# Patient Record
Sex: Female | Born: 2000 | Race: White | Hispanic: No | Marital: Single
Health system: Southern US, Community
[De-identification: ages and names within clinical notes are randomized; demographics above are authoritative.]

## PROBLEM LIST (undated history)

## (undated) DIAGNOSIS — J45909 Unspecified asthma, uncomplicated: Secondary | ICD-10-CM

## (undated) DIAGNOSIS — G43909 Migraine, unspecified, not intractable, without status migrainosus: Secondary | ICD-10-CM

---
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
## 2020-04-29 IMAGING — CR DG ANKLE COMPLETE 3+V*R*
3 series · 3 of 3 positions shown · non-contrast
Comparison: None.

CLINICAL DATA: RIGHT lateral ankle injury last night, some pain
with weight-bearing.

EXAM:
RIGHT ANKLE - COMPLETE 3+ VIEW

[t ankle joint ap right]
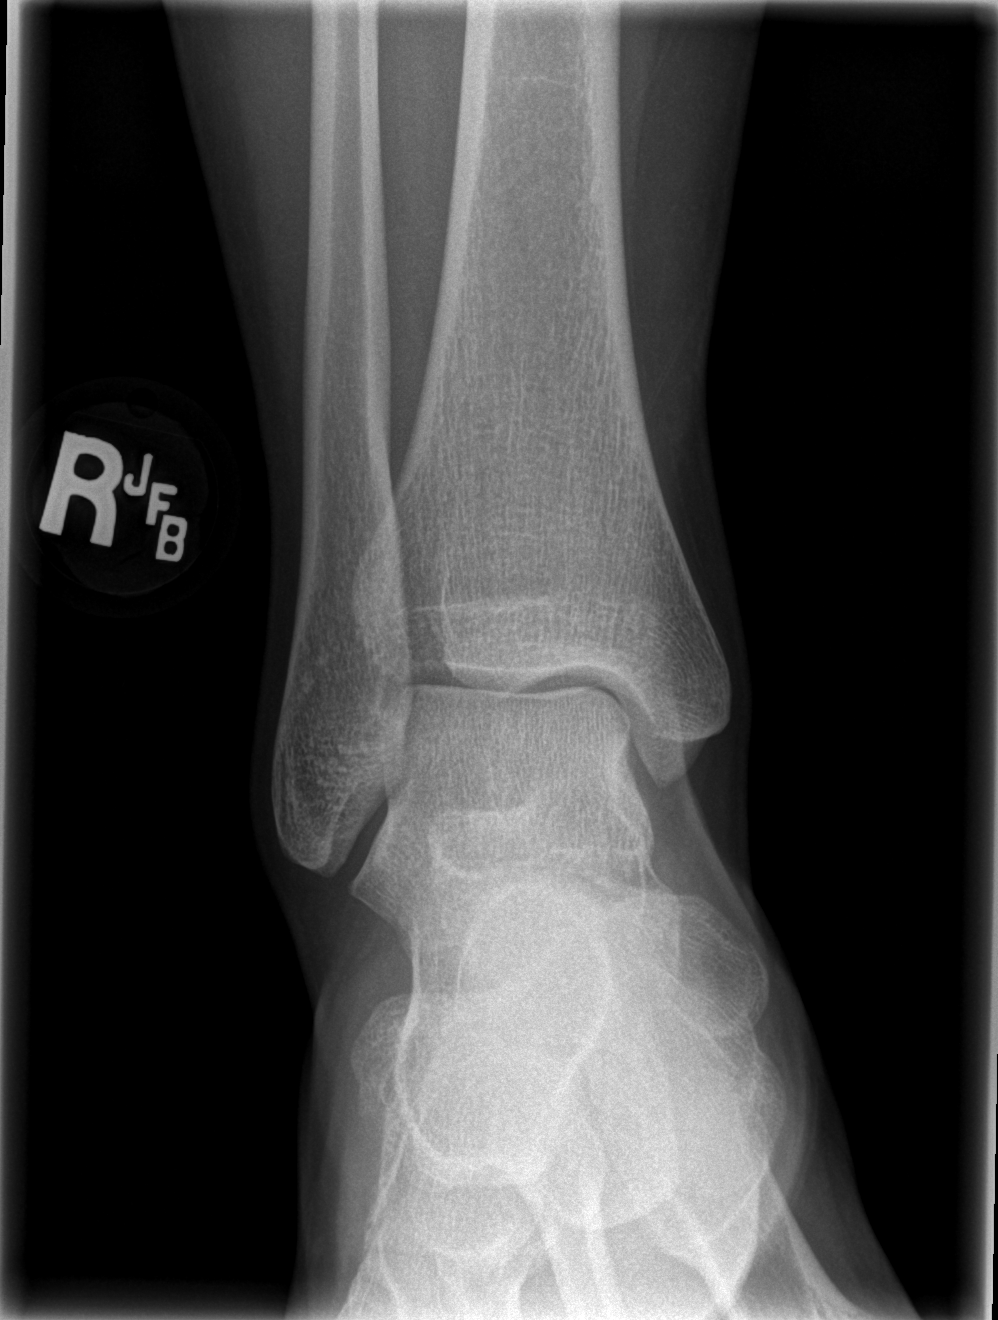

[t ankle joint oblique right]
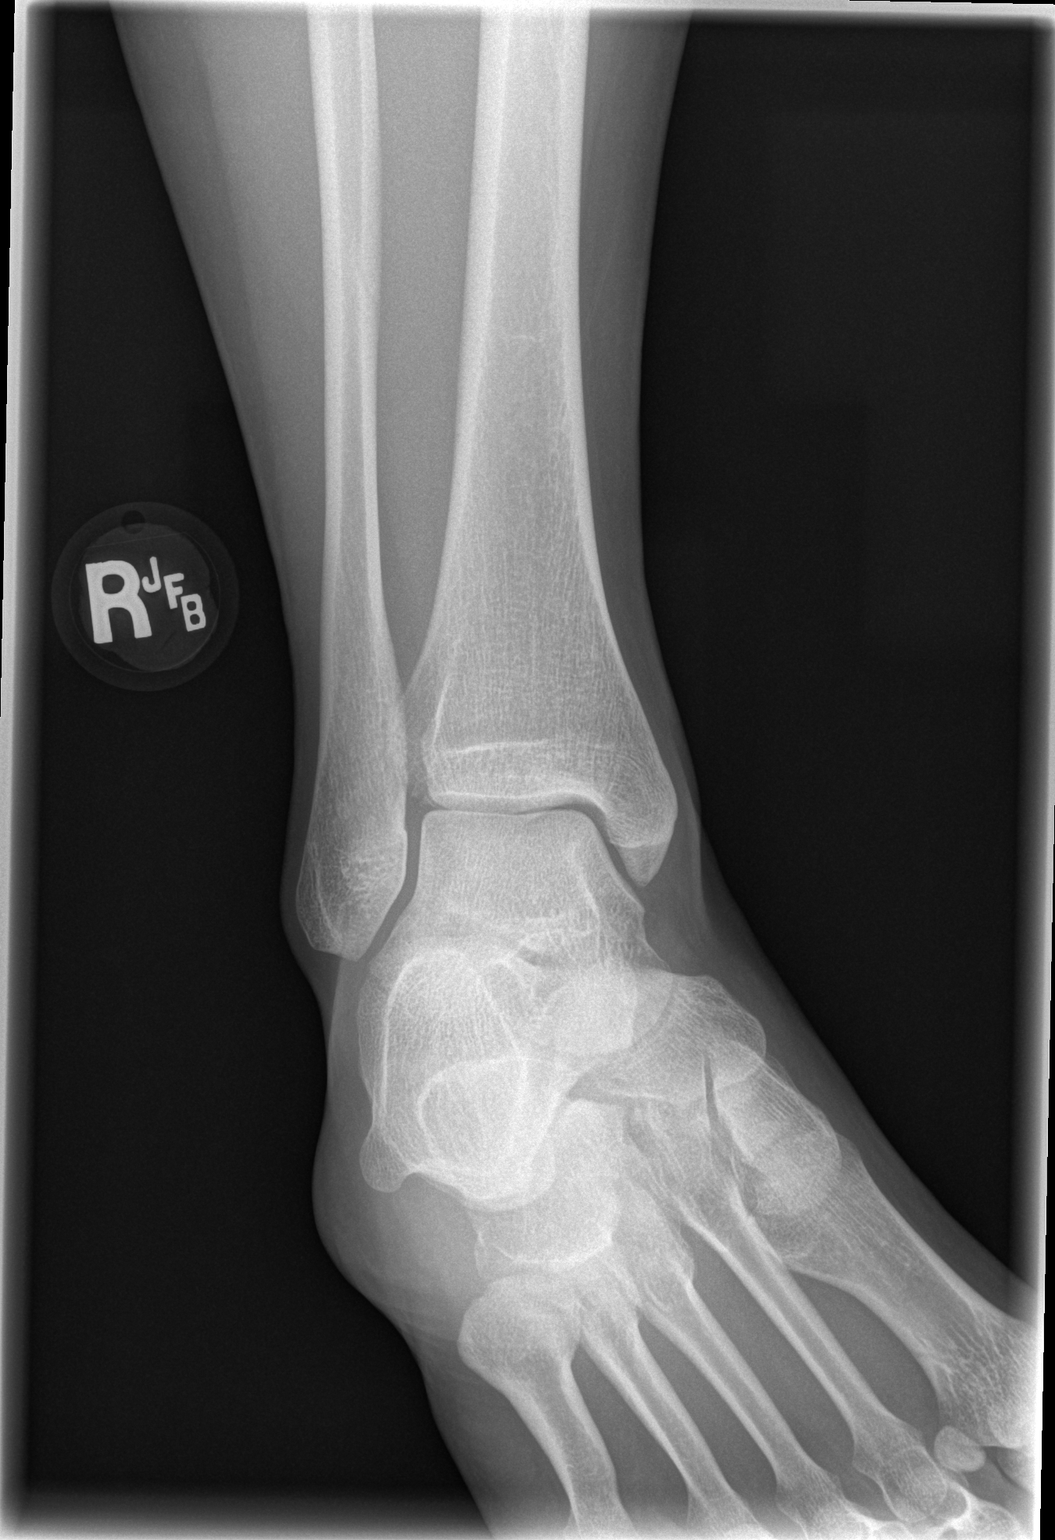

[t ankle joint lat right]
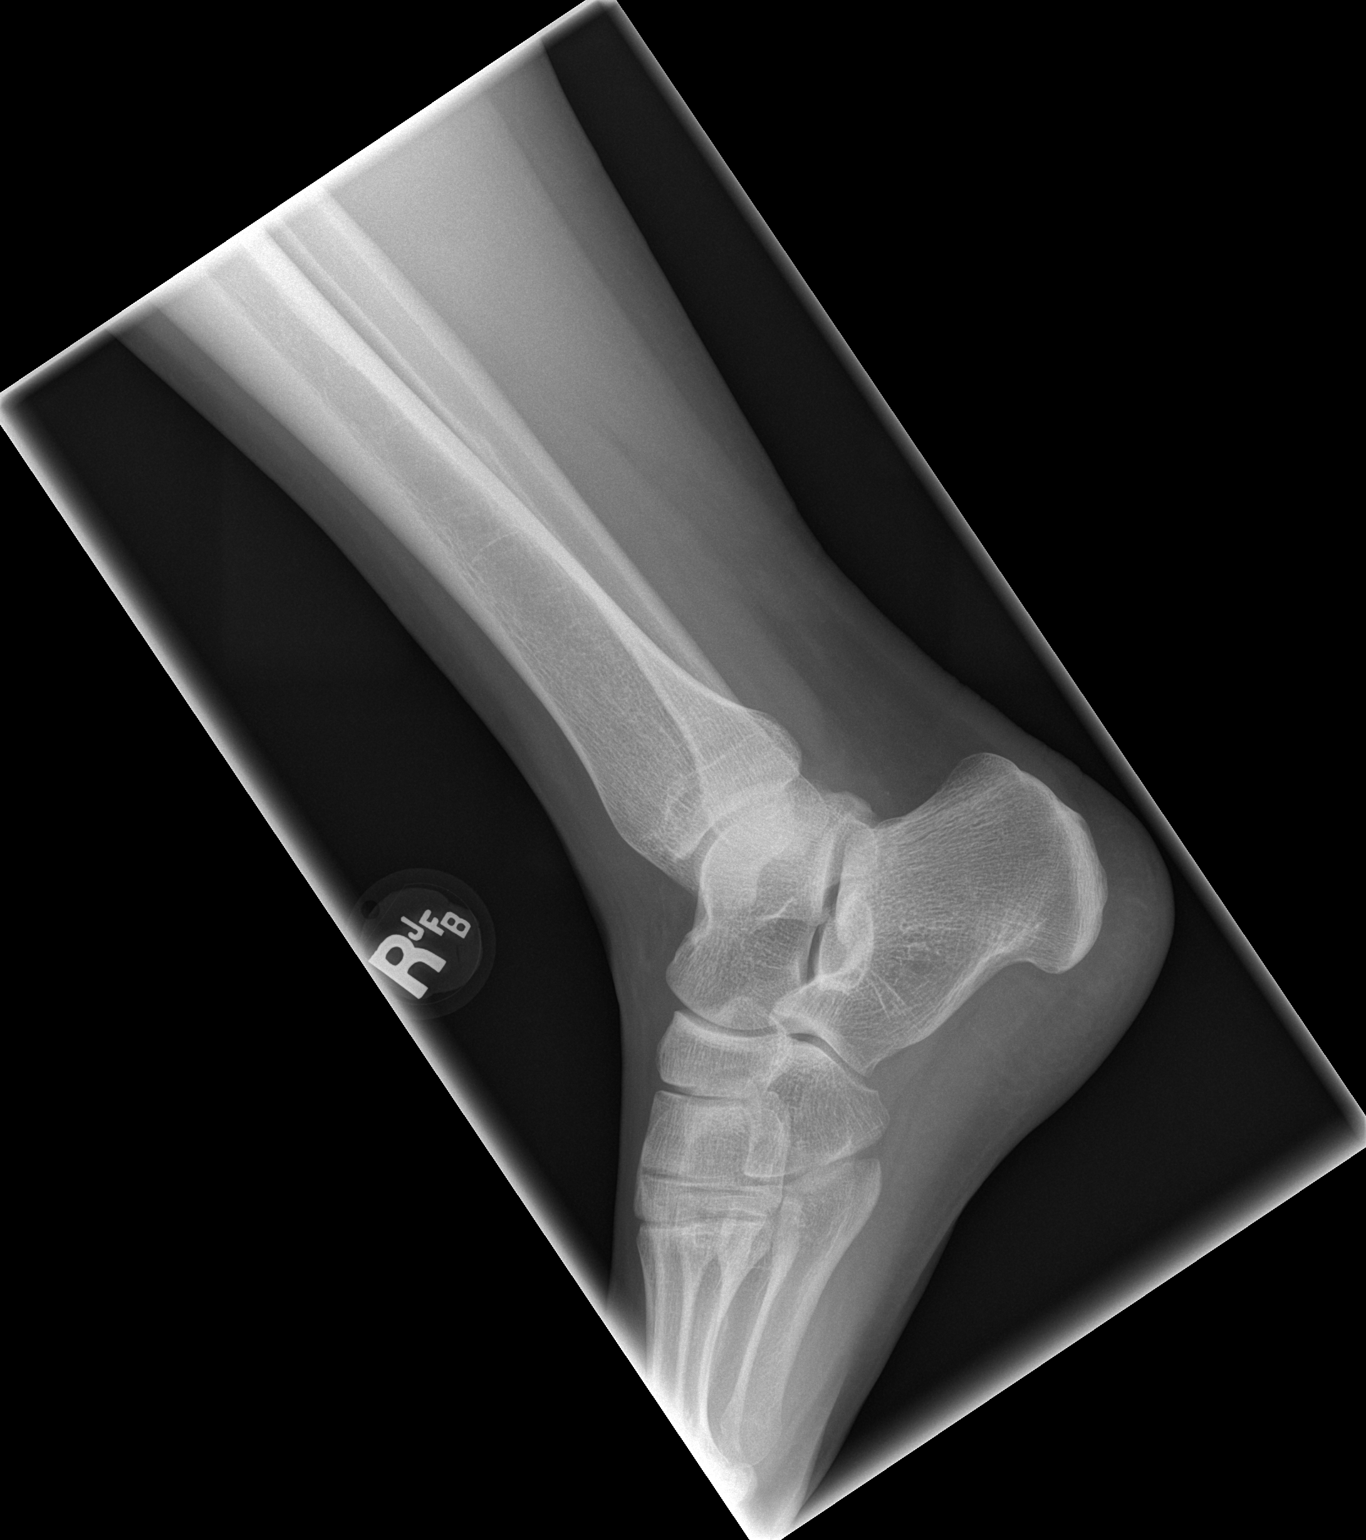

[3 of 3 positions shown; findings below may reference images not displayed]

FINDINGS: Osseous alignment is normal. Ankle mortise is symmetric. No fracture
line or displaced fracture fragment seen. Visualized portions of the
hindfoot and midfoot appear intact and normally aligned. Soft
tissues about the LEFT foot are unremarkable.
IMPRESSION: Negative.

## 2020-05-28 IMAGING — DX DG KNEE COMPLETE 4+V*L*
4 series · 4 of 4 positions shown · non-contrast
Comparison: None.

CLINICAL DATA: Soccer injury with left knee pain

EXAM:
LEFT KNEE - COMPLETE 4+ VIEW

[knee ap]
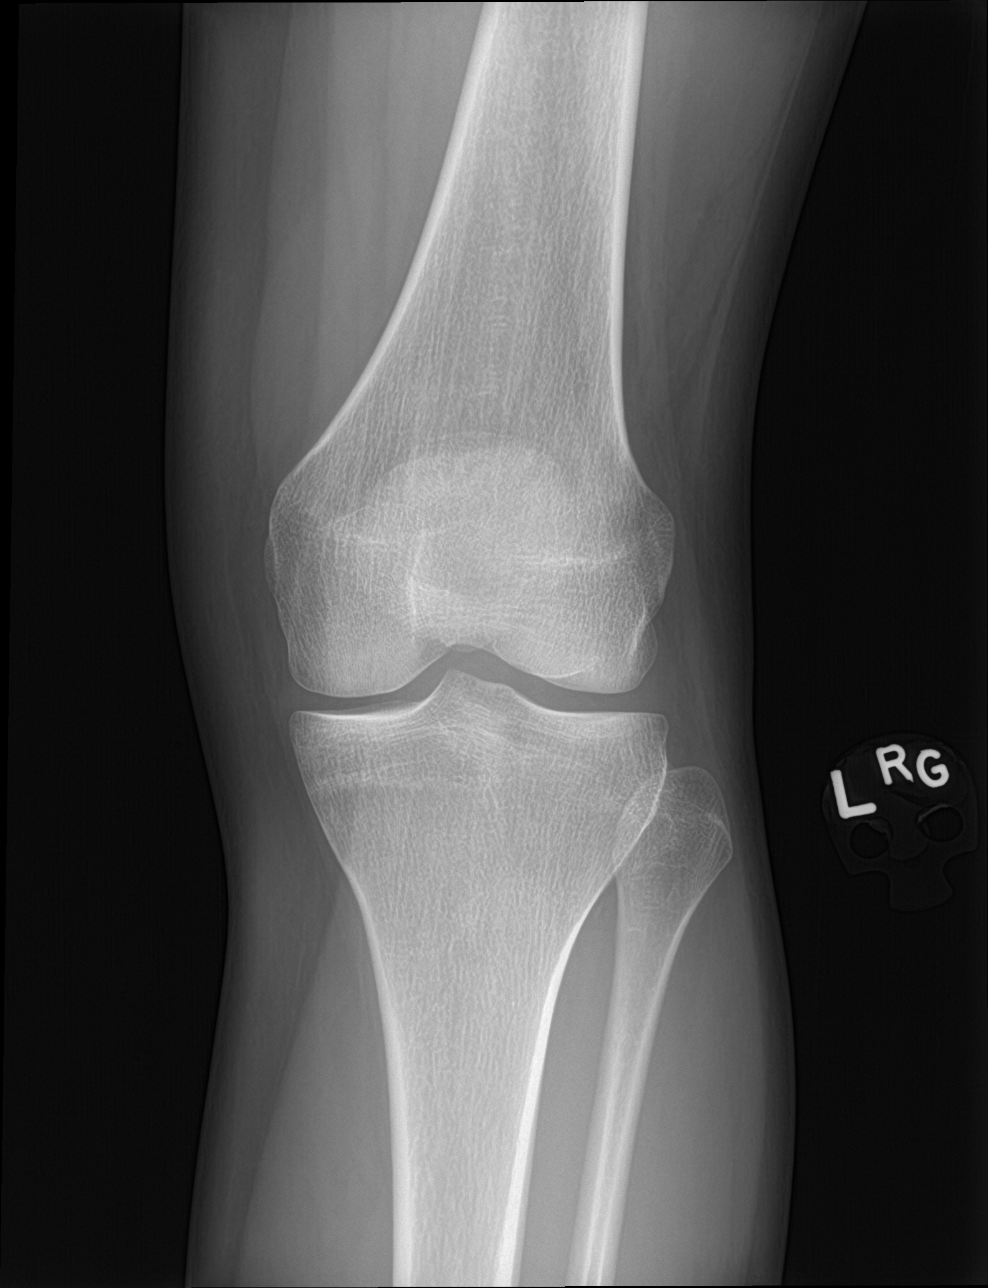

[knee lat]
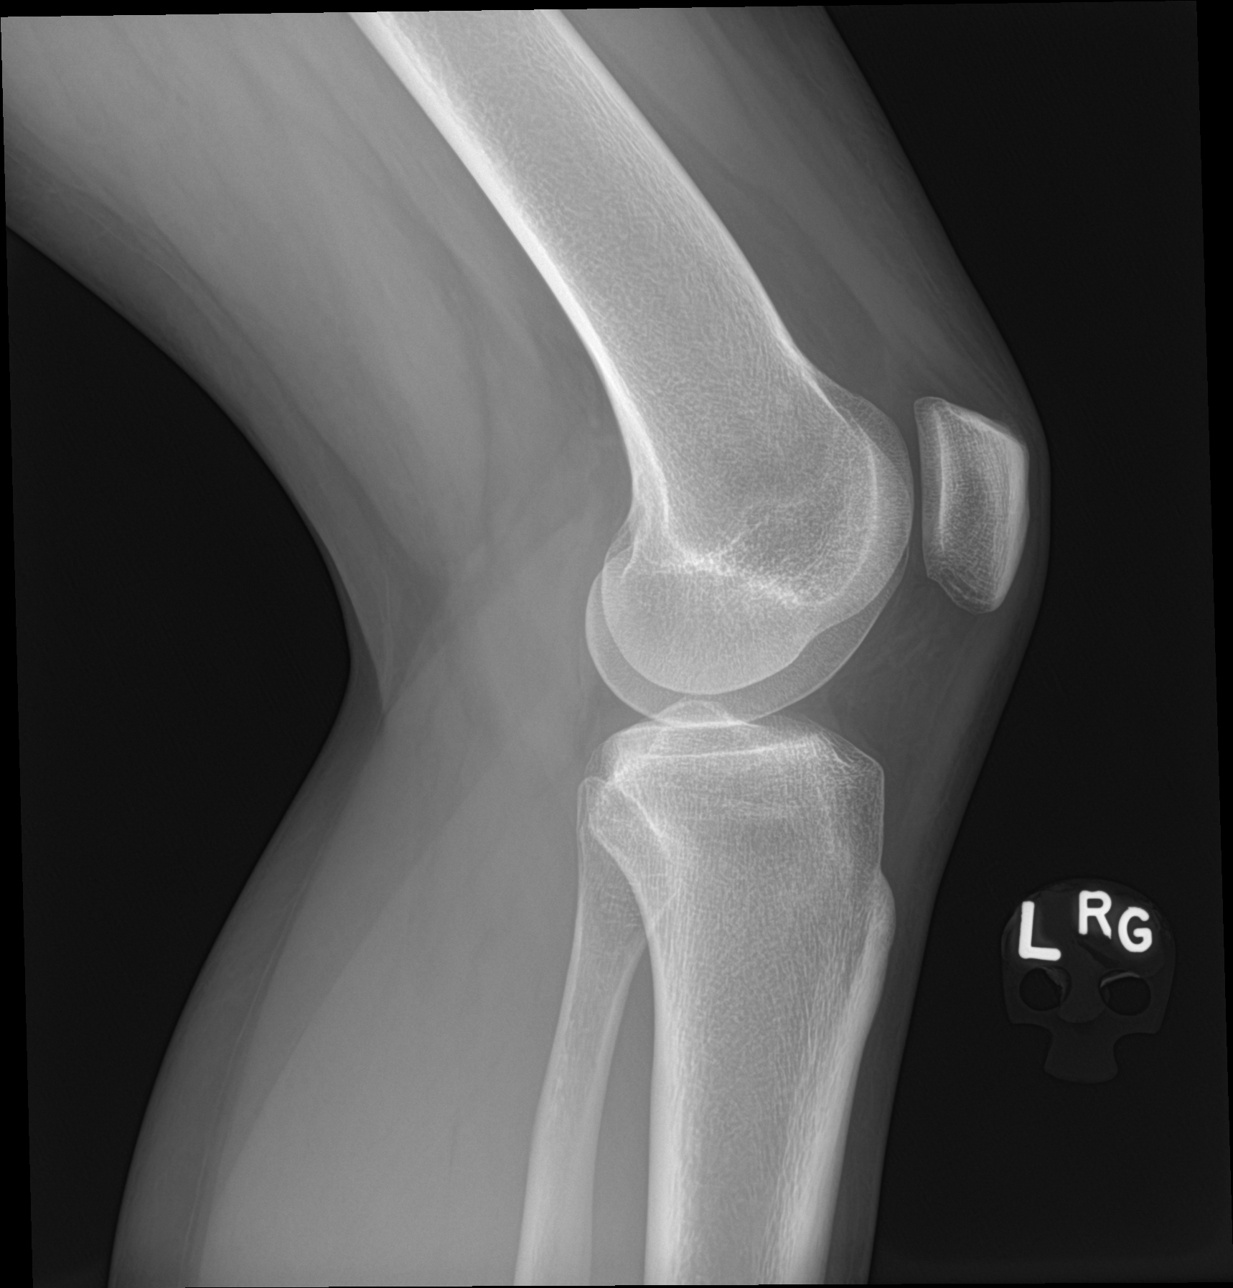

[knee obl (1 of 2)]
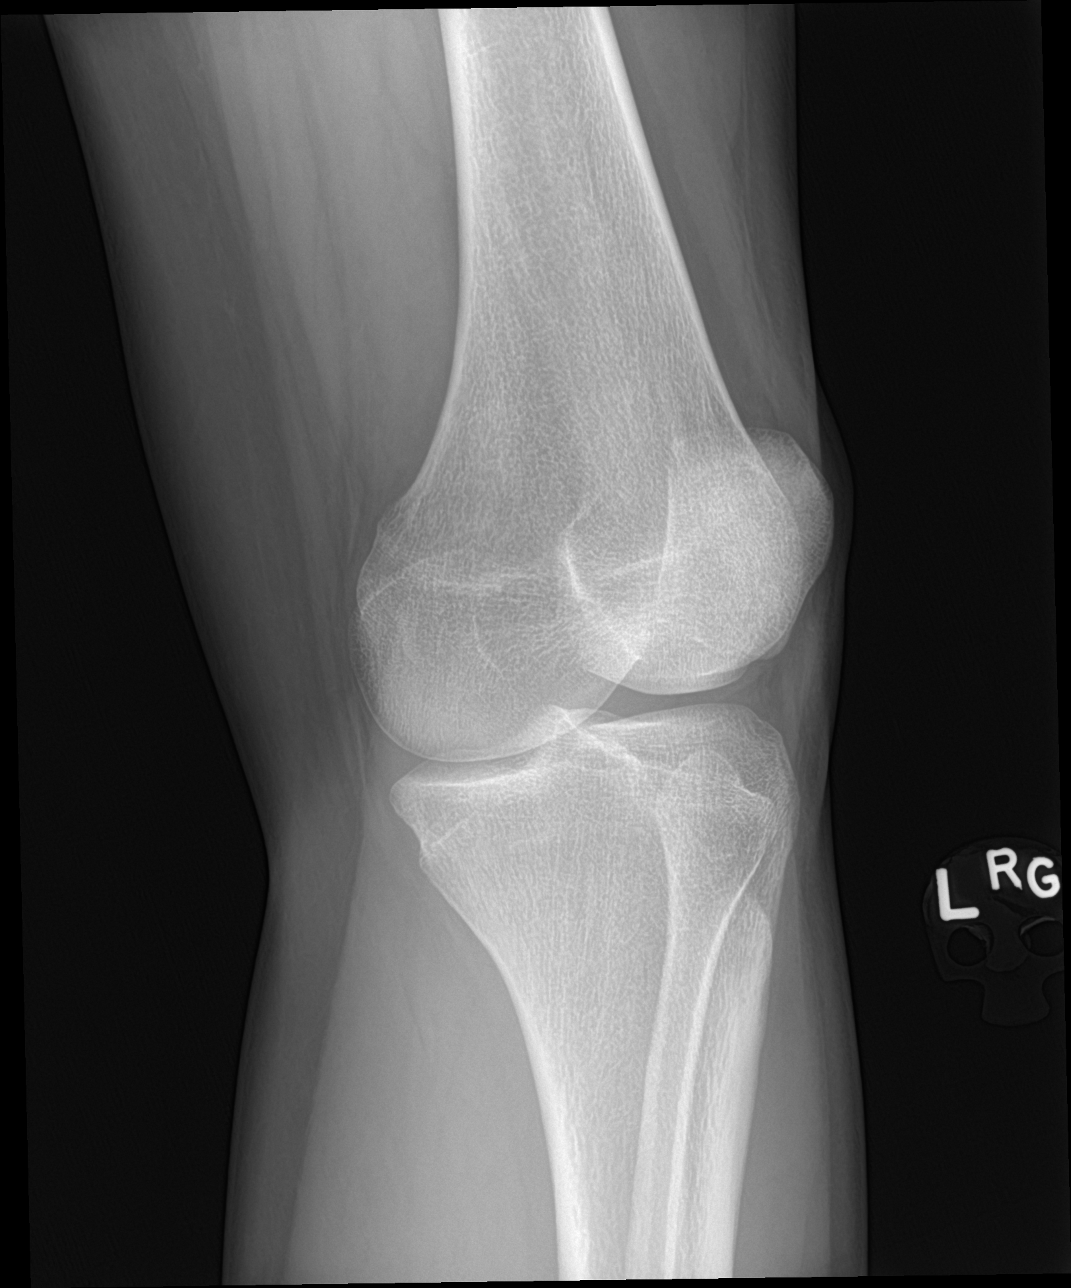

[knee obl (2 of 2)]
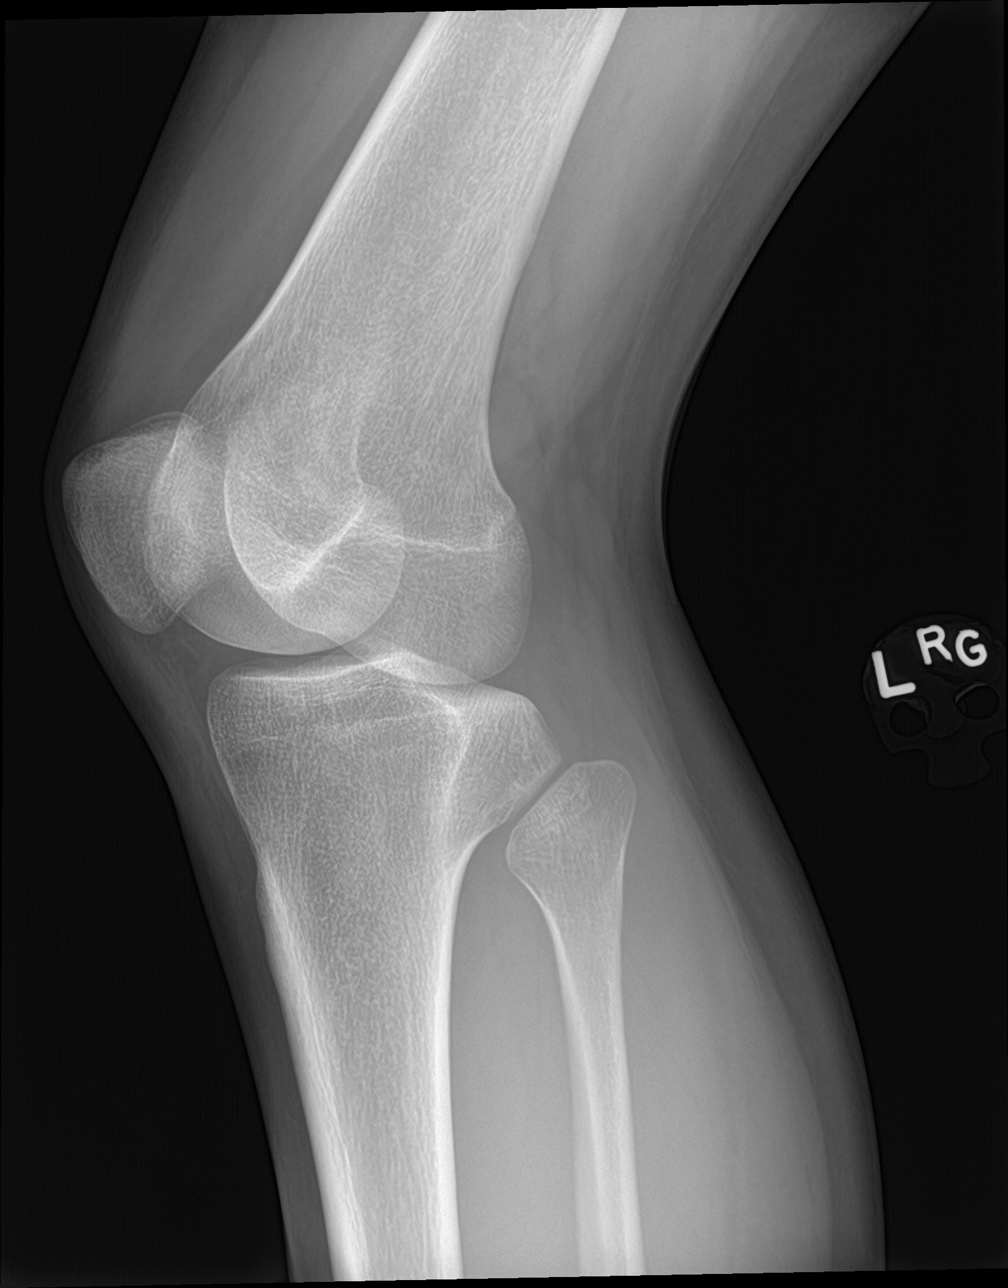

[4 of 4 positions shown; findings below may reference images not displayed]

FINDINGS: No evidence of fracture, dislocation, or joint effusion. No evidence
of arthropathy or other focal bone abnormality. Soft tissues are
unremarkable.
IMPRESSION: Negative.
